# Patient Record
Sex: Male | Born: 1965 | Race: White | Hispanic: No | Marital: Married | Smoking: Never smoker
Health system: Southern US, Community
[De-identification: ages and names within clinical notes are randomized; demographics above are authoritative.]

## PROBLEM LIST (undated history)

## (undated) DIAGNOSIS — E119 Type 2 diabetes mellitus without complications: Secondary | ICD-10-CM

## (undated) DIAGNOSIS — Z87442 Personal history of urinary calculi: Secondary | ICD-10-CM

## (undated) DIAGNOSIS — M1990 Unspecified osteoarthritis, unspecified site: Secondary | ICD-10-CM

## (undated) DIAGNOSIS — Z9289 Personal history of other medical treatment: Secondary | ICD-10-CM

## (undated) DIAGNOSIS — I10 Essential (primary) hypertension: Secondary | ICD-10-CM

## (undated) DIAGNOSIS — S88919A Complete traumatic amputation of unspecified lower leg, level unspecified, initial encounter: Secondary | ICD-10-CM

## (undated) HISTORY — PX: LIPOMA EXCISION: SHX5283

## (undated) HISTORY — PX: BELOW KNEE LEG AMPUTATION: SUR23

---

## 2006-01-08 ENCOUNTER — Ambulatory Visit: Payer: Self-pay | Admitting: Specialist

## 2006-04-28 ENCOUNTER — Emergency Department: Payer: Self-pay | Admitting: Emergency Medicine

## 2006-05-13 ENCOUNTER — Ambulatory Visit: Payer: Self-pay | Admitting: Specialist

## 2007-01-08 ENCOUNTER — Ambulatory Visit: Payer: Self-pay | Admitting: Specialist

## 2008-01-26 ENCOUNTER — Ambulatory Visit: Payer: Self-pay | Admitting: Specialist

## 2008-03-22 ENCOUNTER — Ambulatory Visit: Payer: Self-pay | Admitting: Specialist

## 2008-10-18 ENCOUNTER — Ambulatory Visit: Payer: Self-pay | Admitting: Specialist

## 2012-05-19 ENCOUNTER — Ambulatory Visit: Payer: Self-pay | Admitting: General Practice

## 2012-07-01 ENCOUNTER — Ambulatory Visit: Payer: Self-pay | Admitting: Urology

## 2012-07-01 LAB — BASIC METABOLIC PANEL
Anion Gap: 8 (ref 7–16)
BUN: 15 mg/dL (ref 7–18)
Chloride: 106 mmol/L (ref 98–107)
Co2: 28 mmol/L (ref 21–32)
Creatinine: 1.07 mg/dL (ref 0.60–1.30)
EGFR (African American): >60
EGFR (Non-African Amer.): >60
Osmolality: 285 (ref 275–301)
Potassium: 3.9 mmol/L (ref 3.5–5.1)

## 2012-07-03 ENCOUNTER — Ambulatory Visit: Payer: Self-pay | Admitting: Urology

## 2015-01-17 ENCOUNTER — Emergency Department
Admit: 2015-01-17 | Disposition: A | Discharge status: Home / Self Care | Payer: Self-pay | Admitting: Emergency Medicine

## 2016-04-27 ENCOUNTER — Emergency Department
Admission: EM | Admit: 2016-04-27 | Discharge: 2016-04-27 | Disposition: A | Discharge status: Home / Self Care | Payer: Commercial Managed Care - HMO | Attending: Emergency Medicine | Admitting: Emergency Medicine

## 2016-04-27 ENCOUNTER — Emergency Department: Payer: Commercial Managed Care - HMO

## 2016-04-27 ENCOUNTER — Encounter: Payer: Self-pay | Admitting: Urgent Care

## 2016-04-27 DIAGNOSIS — I10 Essential (primary) hypertension: Secondary | ICD-10-CM | POA: Insufficient documentation

## 2016-04-27 DIAGNOSIS — Z87442 Personal history of urinary calculi: Secondary | ICD-10-CM | POA: Insufficient documentation

## 2016-04-27 DIAGNOSIS — N200 Calculus of kidney: Secondary | ICD-10-CM | POA: Diagnosis not present

## 2016-04-27 DIAGNOSIS — R109 Unspecified abdominal pain: Secondary | ICD-10-CM | POA: Diagnosis present

## 2016-04-27 HISTORY — DX: Essential (primary) hypertension: I10

## 2016-04-27 LAB — BASIC METABOLIC PANEL
ANION GAP: 7 (ref 5–15)
BUN: 29 mg/dL — ABNORMAL HIGH (ref 6–20)
CO2: 26 mmol/L (ref 22–32)
Calcium: 9 mg/dL (ref 8.9–10.3)
Chloride: 106 mmol/L (ref 101–111)
Creatinine, Ser: 1.07 mg/dL (ref 0.61–1.24)
GFR calc non Af Amer: >60 mL/min (ref >60)
Glucose, Bld: 143 mg/dL — ABNORMAL HIGH (ref 65–99)
POTASSIUM: 3.8 mmol/L (ref 3.5–5.1)
SODIUM: 139 mmol/L (ref 135–145)

## 2016-04-27 LAB — CBC WITH DIFFERENTIAL/PLATELET
BASOS ABS: 0.1 10*3/uL (ref 0–0.1)
Basophils Relative: 1 %
EOS ABS: 0.3 10*3/uL (ref 0–0.7)
HCT: 43 % (ref 40.0–52.0)
HEMOGLOBIN: 15.6 g/dL (ref 13.0–18.0)
Lymphocytes Relative: 31 %
Lymphs Abs: 2.9 10*3/uL (ref 1.0–3.6)
MCH: 31.5 pg (ref 26.0–34.0)
MCHC: 36.2 g/dL — ABNORMAL HIGH (ref 32.0–36.0)
MCV: 87.2 fL (ref 80.0–100.0)
Monocytes Absolute: 1.1 10*3/uL — ABNORMAL HIGH (ref 0.2–1.0)
Monocytes Relative: 12 %
Neutro Abs: 4.9 10*3/uL (ref 1.4–6.5)
PLATELETS: 207 10*3/uL (ref 150–440)
RBC: 4.93 MIL/uL (ref 4.40–5.90)
RDW: 13 % (ref 11.5–14.5)
WBC: 9.2 10*3/uL (ref 3.8–10.6)

## 2016-04-27 LAB — URINALYSIS COMPLETE WITH MICROSCOPIC (ARMC ONLY)
BACTERIA UA: NONE SEEN
Bilirubin Urine: NEGATIVE
Glucose, UA: 50 mg/dL — AB
KETONES UR: NEGATIVE mg/dL
LEUKOCYTES UA: NEGATIVE
Nitrite: NEGATIVE
PH: 5 (ref 5.0–8.0)
PROTEIN: NEGATIVE mg/dL
SPECIFIC GRAVITY, URINE: 1.019 (ref 1.005–1.030)
SQUAMOUS EPITHELIAL / LPF: NONE SEEN

## 2016-04-27 MED ORDER — ONDANSETRON HCL 4 MG/2ML IJ SOLN
INTRAMUSCULAR | Status: AC
  Administered 2016-04-27: 4 mg via INTRAVENOUS
  Filled 2016-04-27: qty 2

## 2016-04-27 MED ORDER — OXYCODONE-ACETAMINOPHEN 5-325 MG PO TABS
1.0000 | ORAL_TABLET | Freq: Once | ORAL | Status: AC
  Administered 2016-04-27: 1 via ORAL
  Filled 2016-04-27: qty 1

## 2016-04-27 MED ORDER — KETOROLAC TROMETHAMINE 30 MG/ML IJ SOLN
10.0000 mg | Freq: Once | INTRAMUSCULAR | Status: AC
  Administered 2016-04-27: 9.9 mg via INTRAVENOUS
  Filled 2016-04-27: qty 1

## 2016-04-27 MED ORDER — IBUPROFEN 800 MG PO TABS
800.0000 mg | ORAL_TABLET | Freq: Three times a day (TID) | ORAL | 0 refills | Status: DC | PRN
Start: 2016-04-27 — End: 2018-03-13

## 2016-04-27 MED ORDER — OXYCODONE-ACETAMINOPHEN 10-325 MG PO TABS: 1.0000 | ORAL_TABLET | Freq: Four times a day (QID) | ORAL | 0 refills | Status: DC | PRN

## 2016-04-27 MED ORDER — ONDANSETRON HCL 4 MG/2ML IJ SOLN
4.0000 mg | Freq: Once | INTRAMUSCULAR | Status: AC
  Administered 2016-04-27: 4 mg via INTRAVENOUS
  Filled 2016-04-27: qty 2

## 2016-04-27 MED ORDER — ONDANSETRON HCL 4 MG/2ML IJ SOLN
4.0000 mg | Freq: Once | INTRAMUSCULAR | Status: AC
  Administered 2016-04-27: 4 mg via INTRAVENOUS

## 2016-04-27 MED ORDER — HYDROMORPHONE HCL 1 MG/ML IJ SOLN
1.0000 mg | Freq: Once | INTRAMUSCULAR | Status: AC
  Administered 2016-04-27: 1 mg via INTRAVENOUS
  Filled 2016-04-27: qty 1

## 2016-04-27 MED ORDER — ONDANSETRON 4 MG PO TBDP: 4.0000 mg | ORAL_TABLET | Freq: Three times a day (TID) | ORAL | 0 refills | Status: DC | PRN

## 2016-04-27 MED ORDER — HYDROMORPHONE HCL 1 MG/ML IJ SOLN
1.0000 mg | Freq: Once | INTRAMUSCULAR | Status: AC
  Administered 2016-04-27: 1 mg via INTRAVENOUS

## 2016-04-27 MED ORDER — SODIUM CHLORIDE 0.9 % IV BOLUS (SEPSIS)
1000.0000 mL | Freq: Once | INTRAVENOUS | Status: AC
  Administered 2016-04-27: 1000 mL via INTRAVENOUS

## 2016-04-27 MED ORDER — TAMSULOSIN HCL 0.4 MG PO CAPS
0.4000 mg | ORAL_CAPSULE | Freq: Once | ORAL | Status: AC
  Administered 2016-04-27: 0.4 mg via ORAL
  Filled 2016-04-27: qty 1

## 2016-04-27 MED ORDER — TAMSULOSIN HCL 0.4 MG PO CAPS: 0.4000 mg | ORAL_CAPSULE | Freq: Every day | ORAL | 0 refills | Status: DC

## 2016-04-27 MED ORDER — HYDROMORPHONE HCL 1 MG/ML IJ SOLN
INTRAMUSCULAR | Status: AC
  Administered 2016-04-27: 1 mg via INTRAVENOUS
  Filled 2016-04-27: qty 1

## 2016-04-27 NOTE — ED Triage Notes (Signed)
Patient presents with c/o LEFT flank pain that start at 2230. (+) nausea. PMH significant for urolithiasis; last 4-5 years ago.

## 2016-04-27 NOTE — Discharge Instructions (Signed)
1. Take pain & nausea medicines as needed (Percocet/Zofran #30). Make sure to take a stool softener while taking narcotic pain medicines. 2. Take Flomax 0.4mg daily x 14 days. 3. Drink plenty of bottled or filtered water daily. 4. Return to the ER for worsening symptoms, persistent vomiting, fever, difficulty breathing or other concerns.  

## 2016-04-27 NOTE — ED Notes (Signed)
Patient transported to CT 

## 2016-04-27 NOTE — ED Provider Notes (Signed)
[facility] Emergency Department Provider Note   ____________________________________________   First MD Initiated Contact with Patient 04/27/16 0421     (approximate)  I have reviewed the triage vital signs and the nursing notes.   HISTORY  Chief Complaint Nephrolithiasis    HPI Ryan Allen is a 50 y.o. male who presents to the ED from home with a chief complaint of left flank pain. Patient has a history of nephrolithiasis status post lithotripsy, last stone approximately 4 years ago. Onset of left flank pain approximately 10:30 PM. Symptoms associated with nausea only. Patient denies associated fever, chills, chest pain, shortness of breath, abdominal pain, vomiting, dysuria, hematuria, testicular pain or swelling. Denies recent travel or trauma. Nothing makes his symptoms better or worse.   Past Medical History:  Diagnosis Date  . Hypertension   . Kidney stones     There are no active problems to display for this patient.   Past Surgical History:  Procedure Laterality Date  . BELOW KNEE LEG AMPUTATION Left     Prior to Admission medications   Medication Sig Start Date End Date Taking? Authorizing Provider  ibuprofen (ADVIL,MOTRIN) 800 MG tablet Take 1 tablet (800 mg total) by mouth every 8 (eight) hours as needed for moderate pain. 04/27/16   Irean Hong, MD  ondansetron (ZOFRAN ODT) 4 MG disintegrating tablet Take 1 tablet (4 mg total) by mouth every 8 (eight) hours as needed for nausea or vomiting. 04/27/16   Irean Hong, MD  oxyCODONE-acetaminophen (PERCOCET) 10-325 MG tablet Take 1 tablet by mouth every 6 (six) hours as needed for pain. 04/27/16   Irean Hong, MD  tamsulosin (FLOMAX) 0.4 MG CAPS capsule Take 1 capsule (0.4 mg total) by mouth daily. 04/27/16   Irean Hong, MD    Allergies Review of patient's allergies indicates no known allergies.  No family history on file.  Social History Social History  Substance Use Topics  . Smoking  status: Never Smoker  . Smokeless tobacco: Never Used  . Alcohol use Yes    Review of Systems  Constitutional: No fever/chills. Eyes: No visual changes. ENT: No sore throat. Cardiovascular: Denies chest pain. Respiratory: Denies shortness of breath. Gastrointestinal: Positive for left flank pain. No abdominal pain.  Positive for nausea, no vomiting.  No diarrhea.  No constipation. Genitourinary: Negative for dysuria. Musculoskeletal: Negative for back pain. Skin: Negative for rash. Neurological: Negative for headaches, focal weakness or numbness.  10-point ROS otherwise negative.  ____________________________________________   PHYSICAL EXAM:  VITAL SIGNS: ED Triage Vitals  Enc Vitals Group     BP 04/27/16 0358 (!) 159/112     Pulse Rate 04/27/16 0358 74     Resp 04/27/16 0358 20     Temp 04/27/16 0358 97.6 F (36.4 C)     Temp Source 04/27/16 0358 Oral     SpO2 04/27/16 0358 97 %     Weight 04/27/16 0358 275 lb (124.7 kg)     Height 04/27/16 0358  (1.803 m)     Head Circumference --      Peak Flow --      Pain Score 04/27/16 0359 10     Pain Loc --      Pain Edu? --      Excl. in GC? --     Constitutional: Alert and oriented. Well appearing and in moderate acute distress. Eyes: Conjunctivae are normal. PERRL. EOMI. Head: Atraumatic. Nose: No congestion/rhinnorhea. Mouth/Throat: Mucous membranes are  moist.  Oropharynx non-erythematous. Neck: No stridor.   Cardiovascular: Normal rate, regular rhythm. Grossly normal heart sounds.  Good peripheral circulation. Respiratory: Normal respiratory effort.  No retractions. Lungs CTAB. Gastrointestinal: Soft and nontender. No distention. No abdominal bruits. Mild left CVA tenderness. Musculoskeletal: No lower extremity tenderness nor edema.  No joint effusions. Neurologic:  Normal speech and language. No gross focal neurologic deficits are appreciated. No gait instability. Skin:  Skin is warm, dry and intact. No rash  noted. No vesicles. Psychiatric: Mood and affect are normal. Speech and behavior are normal.  ____________________________________________   LABS (all labs ordered are listed, but only abnormal results are displayed)  Labs Reviewed  CBC WITH DIFFERENTIAL/PLATELET - Abnormal; Notable for the following:       Result Value   MCHC 36.2 (*)    Monocytes Absolute 1.1 (*)    All other components within normal limits  BASIC METABOLIC PANEL - Abnormal; Notable for the following:    Glucose, Bld 143 (*)    BUN 29 (*)    All other components within normal limits  URINALYSIS COMPLETEWITH MICROSCOPIC ([facility] ONLY) - Abnormal; Notable for the following:    Color, Urine YELLOW (*)    APPearance HAZY (*)    Glucose, UA 50 (*)    Hgb urine dipstick 2+ (*)    All other components within normal limits   ____________________________________________  EKG  None ____________________________________________  RADIOLOGY  CT renal colic study interpreted per Dr. Karie Kirks: Mild LEFT hydroureteronephrosis with 7 mm LEFT bladder calculus, likely recently passed. Residual 7 mm LEFT lower pole nephrolithiasis. ____________________________________________   PROCEDURES  Procedure(s) performed: None  Procedures  Critical Care performed: No  ____________________________________________   INITIAL IMPRESSION / ASSESSMENT AND PLAN / ED COURSE  Pertinent labs & imaging results that were available during my care of the patient were reviewed by me and considered in my medical decision making (see chart for details).  50 year old male with a history of nephrolithiasis who presents with left flank pain. After 1 round of IV Dilaudid, pain has decreased from 8/10-6/10. Will re-dose with Dilaudid and obtain CT renal colic study.  Clinical Course  Comment By Time  Pain much improved, down to 3/10. Updated patient and spouse on laboratory, urinalysis and imaging results. Plan for analgesia, Flomax and  urology follow-up early next week. Strict return precautions given. Both verbalize understanding and agree with plan of care. Irean Hong, MD 08/04 [phone]     ____________________________________________   FINAL CLINICAL IMPRESSION(S) / ED DIAGNOSES  Final diagnoses:  Kidney stone      NEW MEDICATIONS STARTED DURING THIS VISIT:  New Prescriptions   IBUPROFEN (ADVIL,MOTRIN) 800 MG TABLET    Take 1 tablet (800 mg total) by mouth every 8 (eight) hours as needed for moderate pain.   ONDANSETRON (ZOFRAN ODT) 4 MG DISINTEGRATING TABLET    Take 1 tablet (4 mg total) by mouth every 8 (eight) hours as needed for nausea or vomiting.   OXYCODONE-ACETAMINOPHEN (PERCOCET) 10-325 MG TABLET    Take 1 tablet by mouth every 6 (six) hours as needed for pain.   TAMSULOSIN (FLOMAX) 0.4 MG CAPS CAPSULE    Take 1 capsule (0.4 mg total) by mouth daily.     Note:  This document was prepared using Dragon voice recognition software and may include unintentional dictation errors.    Irean Hong, MD 04/27/16 [phone]

## 2016-05-03 ENCOUNTER — Encounter: Admission: RE | Payer: Self-pay | Source: Ambulatory Visit

## 2016-05-03 ENCOUNTER — Ambulatory Visit: Admission: RE | Admit: 2016-05-03 | Payer: 59 | Source: Ambulatory Visit | Admitting: Urology

## 2016-05-03 SURGERY — LITHOTRIPSY, ESWL
Anesthesia: Moderate Sedation | Laterality: Right

## 2017-01-18 DIAGNOSIS — Z79899 Other long term (current) drug therapy: Secondary | ICD-10-CM | POA: Insufficient documentation

## 2017-01-18 DIAGNOSIS — Z791 Long term (current) use of non-steroidal anti-inflammatories (NSAID): Secondary | ICD-10-CM | POA: Insufficient documentation

## 2017-01-18 DIAGNOSIS — Y9389 Activity, other specified: Secondary | ICD-10-CM | POA: Insufficient documentation

## 2017-01-18 DIAGNOSIS — Y929 Unspecified place or not applicable: Secondary | ICD-10-CM | POA: Diagnosis not present

## 2017-01-18 DIAGNOSIS — X501XXA Overexertion from prolonged static or awkward postures, initial encounter: Secondary | ICD-10-CM | POA: Diagnosis not present

## 2017-01-18 DIAGNOSIS — I10 Essential (primary) hypertension: Secondary | ICD-10-CM | POA: Insufficient documentation

## 2017-01-18 DIAGNOSIS — S8991XA Unspecified injury of right lower leg, initial encounter: Secondary | ICD-10-CM | POA: Diagnosis present

## 2017-01-18 DIAGNOSIS — M79661 Pain in right lower leg: Secondary | ICD-10-CM | POA: Diagnosis not present

## 2017-01-18 DIAGNOSIS — M79604 Pain in right leg: Secondary | ICD-10-CM | POA: Diagnosis not present

## 2017-01-18 DIAGNOSIS — Y999 Unspecified external cause status: Secondary | ICD-10-CM | POA: Insufficient documentation

## 2017-01-18 NOTE — ED Triage Notes (Signed)
Pt presents to ED with c/o RIGHT leg pain x1 week with increasing intensity today. Pt reports pain has been intermittent until tonight when he felt "something pop" with increasing amounts of pain. Pt reports it "aches" at rest and pain increases significantly during movement and ambulation. Pt denies recent injury or trauma. 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

## 2017-01-19 ENCOUNTER — Emergency Department: Payer: 59

## 2017-01-19 ENCOUNTER — Emergency Department
Admission: EM | Admit: 2017-01-19 | Discharge: 2017-01-19 | Disposition: A | Discharge status: Home / Self Care | Payer: 59 | Attending: Emergency Medicine | Admitting: Emergency Medicine

## 2017-01-19 DIAGNOSIS — M79604 Pain in right leg: Secondary | ICD-10-CM

## 2017-01-19 NOTE — ED Notes (Signed)

## 2017-01-19 NOTE — ED Notes (Signed)
Pt in ultrasound at this time. Family in room denies needs.

## 2017-01-19 NOTE — ED Provider Notes (Signed)
[facility] Emergency Department Provider Note   ____________________________________________   First MD Initiated Contact with Patient 01/19/17 0025     (approximate)  I have reviewed the triage vital signs and the nursing notes.   HISTORY  Chief Complaint Leg Pain    HPI Ryan Allen is a 51 y.o. male patient reports he had some pain behind the right leg for about a week. He was getting off his 4 wheeler to go [place] hunting and felt a pop. The leg began to hurt a lot in the lateral part of the knee posteriorly. In other words to the lateral side of the area behind the knee. Patient reports pain trying to extend the leg. He can walk on it. He reports he lost his other leg in a work-related accident 20 years ago when a wall fell on him. He has no other complaints.   Past Medical History:  Diagnosis Date  . Hypertension   . Kidney stones     There are no active problems to display for this patient.   Past Surgical History:  Procedure Laterality Date  . BELOW KNEE LEG AMPUTATION Left     Prior to Admission medications   Medication Sig Start Date End Date Taking? Authorizing Provider  ibuprofen (ADVIL,MOTRIN) 800 MG tablet Take 1 tablet (800 mg total) by mouth every 8 (eight) hours as needed for moderate pain. 04/27/16   Irean Hong, MD  ondansetron (ZOFRAN ODT) 4 MG disintegrating tablet Take 1 tablet (4 mg total) by mouth every 8 (eight) hours as needed for nausea or vomiting. 04/27/16   Irean Hong, MD  oxyCODONE-acetaminophen (PERCOCET) 10-325 MG tablet Take 1 tablet by mouth every 6 (six) hours as needed for pain. 04/27/16   Irean Hong, MD  tamsulosin (FLOMAX) 0.4 MG CAPS capsule Take 1 capsule (0.4 mg total) by mouth daily. 04/27/16   Irean Hong, MD    Allergies Patient has no known allergies.  No family history on file.  Social History Social History  Substance Use Topics  . Smoking status: Never Smoker  . Smokeless tobacco: Never Used  .  Alcohol use Yes    Review of Systems  Constitutional: No fever/chills Eyes: No visual changes. ENT: No sore throat. Cardiovascular: Denies chest pain. Respiratory: Denies shortness of breath. Gastrointestinal: No abdominal pain.  No nausea, no vomiting.  No diarrhea.  No constipation. Genitourinary: Negative for dysuria. Musculoskeletal: Negative for back pain. Skin: Negative for rash. Neurological: Negative for headaches, focal weakness or numbness.  ____________________________________________   PHYSICAL EXAM:  VITAL SIGNS: ED Triage Vitals  Enc Vitals Group     BP 01/18/17 2204 (!) 139/93     Pulse Rate 01/18/17 2204 68     Resp 01/18/17 2204 18     Temp 01/18/17 2204 98.9 F (37.2 C)     Temp Source 01/18/17 2204 Oral     SpO2 01/18/17 2204 95 %     Weight 01/18/17 2159 285 lb (129.3 kg)     Height 01/18/17 2159  (1.803 m)     Head Circumference --      Peak Flow --      Pain Score --      Pain Loc --      Pain Edu? --      Excl. in GC? --     Constitutional: Alert and oriented. Well appearing and in no acute distress. Eyes: Conjunctivae are normal. PERRL. EOMI. Head: Atraumatic.  Nose: No congestion/rhinnorhea. Mouth/Throat: Mucous membranes are moist.  Oropharynx non-erythematous. Neck: No stridor. Cardiovascular: Normal rate, regular rhythm. Grossly normal heart sounds.  Good peripheral circulation. Musculoskeletal: Patient has a left BKA. His right leg has trace edema there is no knee effusion there is some tenderness in the area described above behind the knee. There is no tenderness in the calf. Patient has full range of motion of the ankle patient has some pain on movement of the knee especially if he tries to pick the lower leg up with the knee bent Neurologic:  Normal speech and language. No gross focal neurologic deficits are appreciated. No gait instability. Skin:  Skin is warm, dry and intact. No rash noted. Psychiatric: Mood and affect are  normal. Speech and behavior are normal.  ____________________________________________   LABS (all labs ordered are listed, but only abnormal results are displayed)  Labs Reviewed - No data to display ____________________________________________  EKG   ____________________________________________  RADIOLOGY  dy Result   CLINICAL DATA:  Right leg pain x1 week  EXAM: RIGHT KNEE - COMPLETE 4+ VIEW  COMPARISON:  None.  FINDINGS: No evidence of acute fracture, dislocation, or joint effusion. There appears be old fracture deformity of the proximal fibula. No evidence of arthropathy or other focal bone abnormality. Soft tissues are unremarkable.  IMPRESSION: No acute osseous abnormality. Suggestion old posttraumatic deformity of the proximal fibula.   Electronically Signed   By: Tollie Eth M.D.   On: 01/19/2017 01:27   Study Result   CLINICAL DATA:  RIGHT leg pain for 1 week.  EXAM: RIGHT LOWER EXTREMITY VENOUS DOPPLER ULTRASOUND  TECHNIQUE: Gray-scale sonography with graded compression, as well as color Doppler and duplex ultrasound were performed to evaluate the lower extremity deep venous systems from the level of the common femoral vein and including the common femoral, femoral, profunda femoral, popliteal and calf veins including the posterior tibial, peroneal and gastrocnemius veins when visible. The superficial great saphenous vein was also interrogated. Spectral Doppler was utilized to evaluate flow at rest and with distal augmentation maneuvers in the common femoral, femoral and popliteal veins.  COMPARISON:  None.  FINDINGS: Contralateral Common Femoral Vein: Respiratory phasicity is normal and symmetric with the symptomatic side. No evidence of thrombus. Normal compressibility.  Common Femoral Vein: No evidence of thrombus. Normal compressibility, respiratory phasicity and response to augmentation.  Profunda Femoral Vein: No  evidence of thrombus. Normal compressibility and flow on color Doppler imaging.  Femoral Vein: No evidence of thrombus. Normal compressibility, respiratory phasicity and response to augmentation.  Popliteal Vein: No evidence of thrombus. Normal compressibility, respiratory phasicity and response to augmentation.  Calf Veins: No evidence of thrombus. Normal compressibility and flow on color Doppler imaging.  Other Findings:  None.  IMPRESSION: No RIGHT lower extremity deep vein thrombosis.   Electronically Signed   By: Awilda Metro M.D.    ____________________________________________   PROCEDURES  Procedure(s) performed:   Procedures  Critical Care performed:   ____________________________________________   INITIAL IMPRESSION / ASSESSMENT AND PLAN / ED COURSE  Pertinent labs & imaging results that were available during my care of the patient were reviewed by me and considered in my medical decision making (see chart for details).        ____________________________________________   FINAL CLINICAL IMPRESSION(S) / ED DIAGNOSES  Final diagnoses:  Right leg pain      NEW MEDICATIONS STARTED DURING THIS VISIT:  New Prescriptions   No medications on file  Note:  This document was prepared using Dragon voice recognition software and may include unintentional dictation errors.    Arnaldo Natal, MD 01/19/17 [phone]

## 2017-01-19 NOTE — Discharge Instructions (Signed)
Use walker or crutches to help with the pain in your knee. Please follow-up with Dr. Martha Clan, orthopedics.  Call his office Monday morning. Tell them you were in the emergency room. They should be on CDU this coming week. Use Motrin 800 mg 3 times a day for the pain. Take it with food. Return for worse pain fever or swelling or redness.

## 2017-01-19 NOTE — ED Notes (Signed)
XR at bedside

## 2017-01-19 NOTE — ED Notes (Signed)
Pt. Returned to tx. room in stable condition with no acute changes since departure from unit for scans.   

## 2017-07-11 DIAGNOSIS — I10 Essential (primary) hypertension: Secondary | ICD-10-CM | POA: Diagnosis not present

## 2017-08-07 DIAGNOSIS — I10 Essential (primary) hypertension: Secondary | ICD-10-CM | POA: Diagnosis not present

## 2017-08-07 DIAGNOSIS — E781 Pure hyperglyceridemia: Secondary | ICD-10-CM | POA: Diagnosis not present

## 2017-08-07 DIAGNOSIS — Z89512 Acquired absence of left leg below knee: Secondary | ICD-10-CM | POA: Diagnosis not present

## 2017-08-08 DIAGNOSIS — E781 Pure hyperglyceridemia: Secondary | ICD-10-CM | POA: Diagnosis not present

## 2017-08-08 DIAGNOSIS — R5383 Other fatigue: Secondary | ICD-10-CM | POA: Diagnosis not present

## 2018-03-10 DIAGNOSIS — E781 Pure hyperglyceridemia: Secondary | ICD-10-CM | POA: Diagnosis not present

## 2018-03-10 DIAGNOSIS — R739 Hyperglycemia, unspecified: Secondary | ICD-10-CM | POA: Diagnosis not present

## 2018-03-10 DIAGNOSIS — I10 Essential (primary) hypertension: Secondary | ICD-10-CM | POA: Diagnosis not present

## 2018-03-12 DIAGNOSIS — R110 Nausea: Secondary | ICD-10-CM | POA: Diagnosis not present

## 2018-03-12 DIAGNOSIS — N201 Calculus of ureter: Secondary | ICD-10-CM | POA: Diagnosis not present

## 2018-03-12 DIAGNOSIS — N23 Unspecified renal colic: Secondary | ICD-10-CM | POA: Diagnosis not present

## 2018-03-17 ENCOUNTER — Encounter
Admission: RE | Admit: 2018-03-17 | Discharge: 2018-03-17 | Disposition: A | Discharge status: Home / Self Care | Payer: Commercial Managed Care - HMO | Source: Ambulatory Visit | Attending: Urology | Admitting: Urology

## 2018-03-17 ENCOUNTER — Other Ambulatory Visit: Payer: Self-pay

## 2018-03-17 HISTORY — DX: Complete traumatic amputation of unspecified lower leg, level unspecified, initial encounter: S88.919A

## 2018-03-17 HISTORY — DX: Personal history of other medical treatment: Z92.89

## 2018-03-17 HISTORY — DX: Personal history of urinary calculi: Z87.442

## 2018-03-17 HISTORY — DX: Unspecified osteoarthritis, unspecified site: M19.90

## 2018-03-17 HISTORY — DX: Type 2 diabetes mellitus without complications: E11.9

## 2018-03-17 NOTE — Pre-Procedure Instructions (Signed)
PT WAS A PREOP PHONE CALL TODAY,THE DAY BEFORE HIS SURGERY. PT IS NEEDING AN EKG DONE DUE TO HTN.  TRIED TO GET PT TO COME ON TO PAT TODAY FOR THIS AND HE IS WORKING OUT OF TOWN IN [place] AND CANNOT COME.  EKG TO BE DONE AM OF SURGERY

## 2018-03-17 NOTE — H&P (Signed)
NAME: Sherlene ShamsSPOON, Aristide C. MEDICAL RECORD [id number] ACCOUNT [id number] DATE OF BIRTH:Apr 06, 1966 FACILITY: [facility] LOCATION: [facility] PHYSICIAN:MICHAEL Gilles Chiquito. WOLFF, MD  HISTORY AND PHYSICAL  DATE OF ADMISSION:  03/18/2018  CHIEF COMPLAINT:  Left flank pain.    HISTORY OF PRESENT ILLNESS:  The patient is a 52 year old white male who presented to the office with a 3-4 week history of left flank pain.  He was found to have an 8 x 8 kidney stone at in the left UPJ.  He comes in now for cystoscopy with stent  placement.  The patient is on chronic aspirin and lithotripsy must be delayed at least one week because of this.  ALLERGIES:  No drug allergies.  MEDICATIONS:  Lisinopril and aspirin.  PAST SURGICAL HISTORY: 1.  Left knee amputation due to an injury in 1993. 2.  Lithotripsy 2013.  PAST AND CURRENT MEDICAL CONDITIONS:  Hypertension.  REVIEW OF SYSTEMS:  The patient denies chest pain, shortness of breath, diabetes, stroke or heart disease.  SOCIAL HISTORY:  The patient denied tobacco use.  He consumes five to seven alcoholic beverages per week.  FAMILY HISTORY:  Parents have heart disease, hypertension, hypercholesterolemia and diabetes.  PHYSICAL EXAMINATION: GENERAL:  Well-nourished white male in no acute distress. HEENT:  Sclerae were clear. NECK:  No audible carotid bruits. PULMONARY:  Lungs clear to auscultation. CARDIOVASCULAR:  Regular rhythm and rate. ABDOMEN:  Soft, nontender abdomen. GENITOURINARY:  Circumcised  with atrophic testes.  He had a 2 cm left spermatocele.   RECTAL EXAM:  Deferred. NEUROMUSCULAR:  Alert and oriented x3.  ASSESSMENT: 1.  Left ureteropelvic junction calculus with renal colic. 2.  Exposure to ASPIRIN.  PLAN:  Cystoscopy with left stent placement.  AN/NUANCE  D:03/17/2018 T:03/17/2018 JOB:[id number]

## 2018-03-17 NOTE — Patient Instructions (Signed)
Your procedure is scheduled on: 03-18-18  Report to Same Day Surgery 2nd floor medical mall [facility](Medical Mall Entrance-take elevator on left to 2nd floor.  Check in with surgery information desk.) To find out your arrival time please call [phone] between 1PM - 3PM on 03-17-18   Remember: Instructions that are not followed completely may result in serious medical risk, up to and including death, or upon the discretion of your surgeon and anesthesiologist your surgery may need to be rescheduled.    _x___ 1. Do not eat food after midnight the night before your procedure. NO GUM OR CANDY AFTER MIDNIGHT.  You may drink clear liquids up to 2 hours before you are scheduled to arrive at the hospital for your procedure.  Do not drink clear liquids within 2 hours of your scheduled arrival to the hospital.  Clear liquids include  --Water or Apple juice without pulp  --Clear carbohydrate beverage such as ClearFast or Gatorade  --Black Coffee or Clear Tea (No milk, no creamers, do not add anything to the coffee or Tea      __x__ 2. No Alcohol for 24 hours before or after surgery.   __x__3. No Smoking or e-cigarettes for 24 prior to surgery.  Do not use any chewable tobacco products for at least 6 hour prior to surgery   ____  4. Bring all medications with you on the day of surgery if instructed.    __x__ 5. Notify your doctor if there is any change in your medical condition     (cold, fever, infections).    x___6. On the morning of surgery brush your teeth with toothpaste and water.  You may rinse your mouth with mouth wash if you wish.  Do not swallow any toothpaste or mouthwash.   Do not wear jewelry, make-up, hairpins, clips or nail polish.  Do not wear lotions, powders, or perfumes. You may wear deodorant.  Do not shave 48 hours prior to surgery. Men may shave face and neck.  Do not bring valuables to the hospital.    [facility] is not responsible for any belongings or valuables.        Contacts, dentures or bridgework may not be worn into surgery.  Leave your suitcase in the car. After surgery it may be brought to your room.  For patients admitted to the hospital, discharge time is determined by your treatment team.  _  Patients discharged the day of surgery will not be allowed to drive home.  You will need someone to drive you home and stay with you the night of your procedure.    Please read over the following fact sheets that you were given:   [facility] Preparing for [facility]   _x___ Take anti-hypertensive listed below, cardiac, seizure, asthma, anti-reflux and psychiatric medicines. These include:  1. YOU MAY TAKE NUCYNTA DAY OF SURGERY IF NEEDED WITH A SMALL SIP OF WATER  2.  3.  4.  5.  6.  ____Fleets enema or Magnesium Citrate as directed.   ____ Use CHG Soap or sage wipes as directed on instruction sheet   ____ Use inhalers on the day of surgery and bring to hospital day of surgery  ____ Stop Metformin and Janumet 2 days prior to surgery.    ____ Take 1/2 of usual insulin dose the night before surgery and none on the morning surgery.   ____ Follow recommendations from Cardiologist, Pulmonologist or PCP regarding  stopping Aspirin, Coumadin, Plavix ,Eliquis, Effient, or Pradaxa,  and Pletal.  X____Stop Anti-inflammatories such as Advil, Aleve, Ibuprofen, Motrin, Naproxen, Naprosyn, Goodies powders or aspirin products NOW- OK to take Tylenol OR NUCYNTA IF NEEDED   ____ Stop supplements until after surgery.    ____ Bring C-Pap to the hospital.

## 2018-03-18 ENCOUNTER — Ambulatory Visit
Admission: RE | Admit: 2018-03-18 | Discharge: 2018-03-18 | Disposition: A | Discharge status: Home / Self Care | Payer: Commercial Managed Care - HMO | Source: Ambulatory Visit | Attending: Urology | Admitting: Urology

## 2018-03-18 ENCOUNTER — Ambulatory Visit: Payer: Commercial Managed Care - HMO | Admitting: Certified Registered"

## 2018-03-18 ENCOUNTER — Encounter
Admission: RE | Disposition: A | Discharge status: Home / Self Care | Payer: Self-pay | Source: Ambulatory Visit | Attending: Urology

## 2018-03-18 DIAGNOSIS — N201 Calculus of ureter: Secondary | ICD-10-CM | POA: Diagnosis present

## 2018-03-18 DIAGNOSIS — N200 Calculus of kidney: Secondary | ICD-10-CM | POA: Diagnosis not present

## 2018-03-18 DIAGNOSIS — Z89512 Acquired absence of left leg below knee: Secondary | ICD-10-CM | POA: Insufficient documentation

## 2018-03-18 DIAGNOSIS — Z8249 Family history of ischemic heart disease and other diseases of the circulatory system: Secondary | ICD-10-CM | POA: Insufficient documentation

## 2018-03-18 DIAGNOSIS — Z833 Family history of diabetes mellitus: Secondary | ICD-10-CM | POA: Diagnosis not present

## 2018-03-18 DIAGNOSIS — Z7982 Long term (current) use of aspirin: Secondary | ICD-10-CM | POA: Insufficient documentation

## 2018-03-18 DIAGNOSIS — Z79899 Other long term (current) drug therapy: Secondary | ICD-10-CM | POA: Insufficient documentation

## 2018-03-18 DIAGNOSIS — M1990 Unspecified osteoarthritis, unspecified site: Secondary | ICD-10-CM | POA: Diagnosis not present

## 2018-03-18 DIAGNOSIS — E119 Type 2 diabetes mellitus without complications: Secondary | ICD-10-CM | POA: Diagnosis not present

## 2018-03-18 DIAGNOSIS — I10 Essential (primary) hypertension: Secondary | ICD-10-CM | POA: Diagnosis not present

## 2018-03-18 HISTORY — PX: CYSTOSCOPY W/ URETERAL STENT PLACEMENT: SHX1429

## 2018-03-18 HISTORY — PX: CYSTOSCOPY WITH STENT PLACEMENT: SHX5790

## 2018-03-18 LAB — GLUCOSE, CAPILLARY
GLUCOSE-CAPILLARY: 143 mg/dL — AB (ref 70–99)
Glucose-Capillary: 144 mg/dL — ABNORMAL HIGH (ref 70–99)

## 2018-03-18 SURGERY — CYSTOSCOPY, WITH STENT INSERTION
Anesthesia: General | Laterality: Left | Wound class: "Clean Contaminated "

## 2018-03-18 MED ORDER — LIDOCAINE HCL URETHRAL/MUCOSAL 2 % EX GEL
CUTANEOUS | Status: AC
Start: 2018-03-18 — End: ?
  Filled 2018-03-18: qty 10

## 2018-03-18 MED ORDER — PROPOFOL 10 MG/ML IV BOLUS
INTRAVENOUS | Status: DC | PRN
  Administered 2018-03-18: 200 mg via INTRAVENOUS

## 2018-03-18 MED ORDER — CIPROFLOXACIN HCL 500 MG PO TABS: 500.0000 mg | ORAL_TABLET | Freq: Once | ORAL | 0 refills | Status: AC

## 2018-03-18 MED ORDER — ONDANSETRON HCL 4 MG/2ML IJ SOLN
INTRAMUSCULAR | Status: DC | PRN
  Administered 2018-03-18: 4 mg via INTRAVENOUS

## 2018-03-18 MED ORDER — FENTANYL CITRATE (PF) 100 MCG/2ML IJ SOLN
INTRAMUSCULAR | Status: AC
  Filled 2018-03-18: qty 2

## 2018-03-18 MED ORDER — LIDOCAINE HCL URETHRAL/MUCOSAL 2 % EX GEL
CUTANEOUS | Status: DC | PRN
  Administered 2018-03-18: 1

## 2018-03-18 MED ORDER — PHENYLEPHRINE HCL 10 MG/ML IJ SOLN
INTRAMUSCULAR | Status: DC | PRN
  Administered 2018-03-18 (×2): 150 ug via INTRAVENOUS

## 2018-03-18 MED ORDER — DOCUSATE SODIUM 100 MG PO CAPS: 200.0000 mg | ORAL_CAPSULE | Freq: Two times a day (BID) | ORAL | 3 refills | Status: AC

## 2018-03-18 MED ORDER — BELLADONNA ALKALOIDS-OPIUM 16.2-60 MG RE SUPP
RECTAL | Status: DC | PRN
  Administered 2018-03-18: 1 via RECTAL

## 2018-03-18 MED ORDER — HYOSCYAMINE SULFATE SL 0.125 MG SL SUBL: 0.1250 mg | SUBLINGUAL_TABLET | SUBLINGUAL | 3 refills | Status: DC | PRN

## 2018-03-18 MED ORDER — ACETAMINOPHEN-CODEINE #3 300-30 MG PO TABS: 1.0000 | ORAL_TABLET | ORAL | 2 refills | Status: DC | PRN

## 2018-03-18 MED ORDER — FAMOTIDINE 20 MG PO TABS
ORAL_TABLET | ORAL | Status: AC
  Filled 2018-03-18: qty 1

## 2018-03-18 MED ORDER — IOTHALAMATE MEGLUMINE 43 % IV SOLN
INTRAVENOUS | Status: DC | PRN
  Administered 2018-03-18: 15 mL

## 2018-03-18 MED ORDER — CEFAZOLIN SODIUM-DEXTROSE 2-3 GM-%(50ML) IV SOLR
INTRAVENOUS | Status: DC | PRN
  Administered 2018-03-18: 2 g via INTRAVENOUS

## 2018-03-18 MED ORDER — CEFAZOLIN SODIUM-DEXTROSE 1-4 GM/50ML-% IV SOLN: 1.0000 g | Freq: Once | INTRAVENOUS | Status: DC

## 2018-03-18 MED ORDER — FAMOTIDINE 20 MG PO TABS
20.0000 mg | ORAL_TABLET | Freq: Once | ORAL | Status: AC
  Administered 2018-03-18: 20 mg via ORAL

## 2018-03-18 MED ORDER — FENTANYL CITRATE (PF) 100 MCG/2ML IJ SOLN
INTRAMUSCULAR | Status: DC | PRN
  Administered 2018-03-18 (×2): 50 ug via INTRAVENOUS

## 2018-03-18 MED ORDER — PROMETHAZINE HCL 25 MG/ML IJ SOLN: 6.2500 mg | INTRAMUSCULAR | Status: DC | PRN

## 2018-03-18 MED ORDER — CIPROFLOXACIN HCL 500 MG PO TABS: 500.0000 mg | ORAL_TABLET | Freq: Two times a day (BID) | ORAL | 0 refills | Status: DC

## 2018-03-18 MED ORDER — BELLADONNA ALKALOIDS-OPIUM 16.2-60 MG RE SUPP
RECTAL | Status: AC
  Filled 2018-03-18: qty 1

## 2018-03-18 MED ORDER — FENTANYL CITRATE (PF) 100 MCG/2ML IJ SOLN
25.0000 ug | INTRAMUSCULAR | Status: DC | PRN
  Administered 2018-03-18: 25 ug via INTRAVENOUS

## 2018-03-18 MED ORDER — DEXAMETHASONE SODIUM PHOSPHATE 10 MG/ML IJ SOLN: INTRAMUSCULAR | Status: DC | PRN

## 2018-03-18 MED ORDER — SODIUM CHLORIDE 0.9 % IV SOLN
INTRAVENOUS | Status: DC
  Administered 2018-03-18: 13:00:00 via INTRAVENOUS

## 2018-03-18 MED ORDER — KETOROLAC TROMETHAMINE 15 MG/ML IJ SOLN
INTRAMUSCULAR | Status: DC | PRN
  Administered 2018-03-18: 30 mg via INTRAVENOUS

## 2018-03-18 MED ORDER — LIDOCAINE HCL (CARDIAC) PF 100 MG/5ML IV SOSY
PREFILLED_SYRINGE | INTRAVENOUS | Status: DC | PRN
  Administered 2018-03-18: 100 mg via INTRAVENOUS

## 2018-03-18 MED ORDER — PROPOFOL 10 MG/ML IV BOLUS
INTRAVENOUS | Status: AC
  Filled 2018-03-18: qty 20

## 2018-03-18 MED ORDER — CEFAZOLIN SODIUM-DEXTROSE 2-4 GM/100ML-% IV SOLN
INTRAVENOUS | Status: AC
  Filled 2018-03-18: qty 100

## 2018-03-18 SURGICAL SUPPLY — 20 items
BAG DRAIN CYSTO-URO LG1000N (MISCELLANEOUS) ×3 IMPLANT
CATH URETL 5X70 OPEN END (CATHETERS) ×2 IMPLANT
CONRAY 43 FOR UROLOGY 50M (MISCELLANEOUS) ×3 IMPLANT
GLOVE BIO SURGEON STRL SZ7 (GLOVE) ×4 IMPLANT
GLOVE BIO SURGEON STRL SZ7.5 (GLOVE) ×3 IMPLANT
GOWN STRL REUS W/ TWL LRG LVL4 (GOWN DISPOSABLE) ×1 IMPLANT
GOWN STRL REUS W/ TWL XL LVL3 (GOWN DISPOSABLE) ×1 IMPLANT
GOWN STRL REUS W/TWL LRG LVL4 (GOWN DISPOSABLE) ×2
GOWN STRL REUS W/TWL XL LVL3 (GOWN DISPOSABLE) ×2
GUIDEWIRE STR ZIPWIRE 035X150 (MISCELLANEOUS) ×3 IMPLANT
KIT TURNOVER CYSTO (KITS) ×3 IMPLANT
PACK CYSTO AR (MISCELLANEOUS) ×3 IMPLANT
SET CYSTO W/LG BORE CLAMP LF (SET/KITS/TRAYS/PACK) ×3 IMPLANT
SOL .9 NS 3000ML IRR  AL (IV SOLUTION) ×2
SOL .9 NS 3000ML IRR UROMATIC (IV SOLUTION) ×1 IMPLANT
SOL PREP PVP 2OZ (MISCELLANEOUS)
SOLUTION PREP PVP 2OZ (MISCELLANEOUS) ×1 IMPLANT
STENT URET 6FRX24 CONTOUR (STENTS) ×1 IMPLANT
STENT URET 6FRX26 CONTOUR (STENTS) ×3 IMPLANT
WATER STERILE IRR 1000ML POUR (IV SOLUTION) ×3 IMPLANT

## 2018-03-18 NOTE — H&P (Signed)
Date of Initial H&P: 03/17/18  History reviewed, patient examined, no change in status, stable for surgery. 

## 2018-03-18 NOTE — Discharge Instructions (Signed)
Kidney Stones Kidney stones (urolithiasis) are rock-like masses that form inside of the kidneys. Kidneys are organs that make pee (urine). A kidney stone can cause very bad pain and can block the flow of pee. The stone usually leaves your body (passes) through your pee. You may need to have a doctor take out the stone. Follow these instructions at home: Eating and drinking  Drink enough fluid to keep your pee clear or pale yellow. This will help you pass the stone.  If told by your doctor, change the foods you eat (your diet). This may include: ? Limiting how much salt (sodium) you eat. ? Eating more fruits and vegetables. ? Limiting how much meat, poultry, fish, and eggs you eat.  Follow instructions from your doctor about eating or drinking restrictions. General instructions  Collect pee samples as told by your doctor. You may need to collect a pee sample: ? 24 hours after a stone comes out. ? 8-12 weeks after a stone comes out, and every 6-12 months after that.  Strain your pee every time you pee (urinate), for as long as told. Use the strainer that your doctor recommends.  Do not throw out the stone. Keep it so that it can be tested by your doctor.  Take over-the-counter and prescription medicines only as told by your doctor.  Keep all follow-up visits as told by your doctor. This is important. You may need follow-up tests. Preventing kidney stones To prevent another kidney stone:  Drink enough fluid to keep your pee clear or pale yellow. This is the best way to prevent kidney stones.  Eat healthy foods.  Avoid certain foods as told by your doctor. You may be told to eat less protein.  Stay at a healthy weight.  Contact a doctor if:  You have pain that gets worse or does not get better with medicine. Get help right away if:  You have a fever or chills.  You get very bad pain.  You get new pain in your belly (abdomen).  You pass out (faint).  You cannot pee. This  information is not intended to replace advice given to you by your health care provider. Make sure you discuss any questions you have with your health care provider. Document Released: 02/27/2008 Document Revised: 05/29/2016 Document Reviewed: 05/29/2016 Elsevier Interactive Patient Education  2017 [employer]. Ureteral Stent Implantation, Care After Refer to this sheet in the next few weeks. These instructions provide you with information about caring for yourself after your procedure. Your health care provider may also give you more specific instructions. Your treatment has been planned according to current medical practices, but problems sometimes occur. Call your health care provider if you have any problems or questions after your procedure. What can I expect after the procedure? After the procedure, it is common to have:  Nausea.  Mild pain when you urinate. You may feel this pain in your lower back or lower abdomen. Pain should stop within a few minutes after you urinate. This may last for up to 1 week.  A small amount of blood in your urine for several days.  Follow these instructions at home:  Medicines  Take over-the-counter and prescription medicines only as told by your health care provider.  If you were prescribed an antibiotic medicine, take it as told by your health care provider. Do not stop taking the antibiotic even if you start to feel better.  Do not drive for 24 hours if you received a sedative.  Do not drive or operate heavy machinery while taking prescription pain medicines. Activity  Return to your normal activities as told by your health care provider. Ask your health care provider what activities are safe for you.  Do not lift anything that is heavier than 10 lb (4.5 kg). Follow this limit for 1 week after your procedure, or for as long as told by your health care provider. General instructions  Watch for any blood in your urine. Call your health care  provider if the amount of blood in your urine increases.  If you have a catheter: ? Follow instructions from your health care provider about taking care of your catheter and collection bag. ? Do not take baths, swim, or use a hot tub until your health care provider approves.  Drink enough fluid to keep your urine clear or pale yellow.  Keep all follow-up visits as told by your health care provider. This is important. Contact a health care provider if:  You have pain that gets worse or does not get better with medicine, especially pain when you urinate.  You have difficulty urinating.  You feel nauseous or you vomit repeatedly during a period of more than 2 days after the procedure. Get help right away if:  Your urine is dark red or has blood clots in it.  You are leaking urine (have incontinence).  The end of the stent comes out of your urethra.  You cannot urinate.  You have sudden, sharp, or severe pain in your abdomen or lower back.  You have a fever. This information is not intended to replace advice given to you by your health care provider. Make sure you discuss any questions you have with your health care provider. Document Released: 05/13/2013 Document Revised: 02/16/2016 Document Reviewed: 03/25/2015 Elsevier Interactive Patient Education  [employer]2018 [employer] Inc[employer]

## 2018-03-18 NOTE — Anesthesia Preprocedure Evaluation (Signed)
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:   Pulmonary neg shortness of breath, neg sleep apnea, neg COPD, Recent URI , Resolved,           Cardiovascular Exercise Tolerance: Good hypertension, (-) angina(-) CAD, (-) Past MI, (-) Cardiac Stents and (-) CABG (-) dysrhythmias (-) Valvular Problems/Murmurs     Neuro/Psych negative neurological ROS  negative psych ROS   GI/Hepatic negative GI ROS, Neg liver ROS,   Endo/Other  negative endocrine ROSdiabetes, Type 2  Renal/GU Renal disease (kidney stones)  negative genitourinary   Musculoskeletal   Abdominal   Peds  Hematology negative hematology ROS (+)   Anesthesia Other Findings Past Medical History: No date: Amputation of leg (HCC) No date: Arthritis No date: Diabetes mellitus without complication (HCC)     Comment:  HGB A1C 8.0 ON 03-10-18-NO MEDS No date: History of blood transfusion No date: History of kidney stones No date: Hypertension   Reproductive/Obstetrics negative OB ROS                             Anesthesia Physical Anesthesia Plan  ASA: III  Anesthesia Plan: General   Post-op Pain Management:    Induction: Intravenous  PONV Risk Score and Plan: 2 and Ondansetron and Dexamethasone  Airway Management Planned: LMA and Oral ETT  Additional Equipment:   Intra-op Plan:   Post-operative Plan: Extubation in OR  Informed Consent: I have reviewed the patients History and Physical, chart,  labs and discussed the procedure including the risks, benefits and alternatives for the proposed anesthesia with the patient or authorized representative who has indicated his/her understanding and acceptance.   Dental Advisory Given  Plan Discussed with: Anesthesiologist, CRNA and Surgeon  Anesthesia Plan Comments:         Anesthesia Quick Evaluation

## 2018-03-18 NOTE — Anesthesia Post-op Follow-up Note (Signed)
Anesthesia QCDR form completed.        

## 2018-03-18 NOTE — Anesthesia Procedure Notes (Signed)
Procedure Name: LMA Insertion Date/Time: 03/18/2018 1:15 PM Performed by: Clyde Lundborgisser, Judeth Gilles L, CRNA Pre-anesthesia Checklist: Patient identified, Emergency Drugs available, Suction available and Patient being monitored Patient Re-evaluated:Patient Re-evaluated prior to induction Oxygen Delivery Method: Circle system utilized Preoxygenation: Pre-oxygenation with 100% oxygen Induction Type: IV induction Ventilation: Mask ventilation without difficulty LMA Size: 5.0 Number of attempts: 1 Placement Confirmation: positive ETCO2,  CO2 detector and breath sounds checked- equal and bilateral Tube secured with: Tape Dental Injury: Teeth and Oropharynx as per pre-operative assessment

## 2018-03-18 NOTE — Op Note (Signed)
Preoperative diagnosis: Left ureterolithiasis  Postoperative diagnosis: Same  Procedure: 1.  Cystoscopy with left double pigtail ureteral stent placement                      2.  Left retrograde pyelogram                      3.  Uroscopy  Surgeon: Suszanne ConnersMichael R. Evelene CroonWolff MD  Anesthesia: General  Indications:See the history and physical. After informed consent the above procedure(s) were requested     Technique and findings: After adequate general anesthesia been obtained the patient was placed into dorsal lithotomy position and the perineum was prepped and draped in the usual fashion.  We confirmed the presence of an 8 x 8 mm stone at the level of the left UPJ.  The 6721 French scope was coupled the camera and visually advanced to the bladder.  The bladder was thoroughly inspected.  No bladder mucosal lesions were identified.  Both ureteral orifices were identified and had clear efflux.  A 6 French ureteral catheter was used to cannulate the left orifice and retrograde pyelography was performed.  Stone was located at the UPJ and contrast could not be seen entering the collecting structures.  The open-ended ureteral stent was then used to push the stone back into the renal pelvis.  Contrast was then seen entering electing structures and no other abnormalities were identified.  A 0.035 Glidewire was advanced through the stent into the renal pelvis.  The open-ended ureteral catheter was removed taking care to leave the guidewire in position.  A 6 x 26 cm double-pigtail stent with retrieval suture attached was advanced over the guidewire and positioned in the ureter.  The guidewire was then removed taking care to leave the stent in position.  The bladder was drained and cystoscope was removed.  10 cc of viscous Xylocaine was instilled within the urethra and the bladder.  A B&O suppository was placed.  The procedure was then terminated and patient transferred to recovery room in stable condition.

## 2018-03-18 NOTE — OR Nursing (Signed)
Discharge instructions discussed with pt and wife. Both voice understanding. 

## 2018-03-18 NOTE — Transfer of Care (Signed)
Immediate Anesthesia Transfer of Care Note  Patient: Ryan ShamsJeff C Allen  Procedure(s) Performed: CYSTOSCOPY WITH STENT PLACEMENT (Left ) CYSTOSCOPY WITH RETROGRADE PYELOGRAM/URETERAL STENT PLACEMENT (Left )  Patient Location: PACU  Anesthesia Type:General  Level of Consciousness: awake, alert  and oriented  Airway & Oxygen Therapy: Patient Spontanous Breathing  Post-op Assessment: Report given to RN, Post -op Vital signs reviewed and stable and Patient moving all extremities  Post vital signs: Reviewed and stable  Last Vitals:  Vitals Value Taken Time  BP    Temp    Pulse 94 03/18/2018  1:58 PM  Resp    SpO2 94 % 03/18/2018  1:58 PM  Vitals shown include unvalidated device data.  Last Pain:  Vitals:   03/18/18 1238  TempSrc: Oral  PainSc: 0-No pain         Complications: No apparent anesthesia complications

## 2018-03-19 ENCOUNTER — Encounter: Payer: Self-pay | Admitting: Urology

## 2018-03-19 NOTE — Anesthesia Postprocedure Evaluation (Signed)
Anesthesia Post Note  Patient: Ryan ShamsJeff C Allen  Procedure(s) Performed: CYSTOSCOPY WITH STENT PLACEMENT (Left ) CYSTOSCOPY WITH RETROGRADE PYELOGRAM/URETERAL STENT PLACEMENT (Left )  Patient location during evaluation: PACU Anesthesia Type: General Level of consciousness: awake and alert Pain management: pain level controlled Vital Signs Assessment: post-procedure vital signs reviewed and stable Respiratory status: spontaneous breathing, nonlabored ventilation, respiratory function stable and patient connected to nasal cannula oxygen Cardiovascular status: blood pressure returned to baseline and stable Postop Assessment: no apparent nausea or vomiting Anesthetic complications: no     Last Vitals:  Vitals:   03/18/18 1438 03/18/18 1500  BP: 104/69 115/73  Pulse: 74 69  Resp: 14 16  Temp:  36.4 C  SpO2: 96% 96%    Last Pain:  Vitals:   03/19/18 0812  TempSrc:   PainSc: 0-No pain                 Lenard SimmerAndrew Silena Wyss

## 2018-03-31 DIAGNOSIS — N201 Calculus of ureter: Secondary | ICD-10-CM | POA: Diagnosis not present

## 2018-03-31 DIAGNOSIS — N200 Calculus of kidney: Secondary | ICD-10-CM | POA: Diagnosis not present

## 2018-03-31 DIAGNOSIS — R310 Gross hematuria: Secondary | ICD-10-CM | POA: Diagnosis not present

## 2018-04-10 ENCOUNTER — Ambulatory Visit
Admission: RE | Admit: 2018-04-10 | Discharge: 2018-04-10 | Disposition: A | Discharge status: Home / Self Care | Payer: 59 | Source: Ambulatory Visit | Attending: Urology | Admitting: Urology

## 2018-04-10 ENCOUNTER — Encounter
Admission: RE | Disposition: A | Discharge status: Home / Self Care | Payer: Self-pay | Source: Ambulatory Visit | Attending: Urology

## 2018-04-10 ENCOUNTER — Other Ambulatory Visit: Payer: Self-pay

## 2018-04-10 ENCOUNTER — Encounter: Payer: Self-pay | Admitting: Urology

## 2018-04-10 DIAGNOSIS — I10 Essential (primary) hypertension: Secondary | ICD-10-CM | POA: Diagnosis not present

## 2018-04-10 DIAGNOSIS — Z6834 Body mass index (BMI) 34.0-34.9, adult: Secondary | ICD-10-CM | POA: Insufficient documentation

## 2018-04-10 DIAGNOSIS — N200 Calculus of kidney: Secondary | ICD-10-CM | POA: Diagnosis not present

## 2018-04-10 DIAGNOSIS — E669 Obesity, unspecified: Secondary | ICD-10-CM | POA: Diagnosis not present

## 2018-04-10 HISTORY — PX: EXTRACORPOREAL SHOCK WAVE LITHOTRIPSY: SHX1557

## 2018-04-10 SURGERY — LITHOTRIPSY, ESWL
Anesthesia: Moderate Sedation | Laterality: Left

## 2018-04-10 MED ORDER — MIDAZOLAM HCL 2 MG/2ML IJ SOLN
1.0000 mg | Freq: Once | INTRAMUSCULAR | Status: AC
  Administered 2018-04-10: 1 mg via INTRAMUSCULAR

## 2018-04-10 MED ORDER — CIPROFLOXACIN HCL 500 MG PO TABS: 500.0000 mg | ORAL_TABLET | Freq: Two times a day (BID) | ORAL | 0 refills | Status: AC

## 2018-04-10 MED ORDER — FUROSEMIDE 10 MG/ML IJ SOLN
INTRAMUSCULAR | Status: AC
  Administered 2018-04-10: 10 mg
  Filled 2018-04-10: qty 2

## 2018-04-10 MED ORDER — FUROSEMIDE 10 MG/ML IJ SOLN
10.0000 mg | Freq: Once | INTRAMUSCULAR | Status: AC
  Administered 2018-04-10: 10 mg via INTRAVENOUS

## 2018-04-10 MED ORDER — FUROSEMIDE 10 MG/ML IJ SOLN
10.0000 mg | Freq: Once | INTRAMUSCULAR | Status: DC
  Administered 2018-04-10: 10 mg via INTRAVENOUS

## 2018-04-10 MED ORDER — ONDANSETRON 8 MG PO TBDP: 8.0000 mg | ORAL_TABLET | Freq: Four times a day (QID) | ORAL | 3 refills | Status: AC | PRN

## 2018-04-10 MED ORDER — DIPHENHYDRAMINE HCL 25 MG PO CAPS
25.0000 mg | ORAL_CAPSULE | ORAL | Status: AC
  Administered 2018-04-10: 25 mg via ORAL

## 2018-04-10 MED ORDER — DEXTROSE-NACL 5-0.45 % IV SOLN
INTRAVENOUS | Status: DC
  Administered 2018-04-10: 12:00:00 via INTRAVENOUS

## 2018-04-10 MED ORDER — NUCYNTA 50 MG PO TABS: 50.0000 mg | ORAL_TABLET | Freq: Four times a day (QID) | ORAL | 0 refills | Status: AC | PRN

## 2018-04-10 MED ORDER — TAMSULOSIN HCL 0.4 MG PO CAPS: 0.4000 mg | ORAL_CAPSULE | Freq: Every day | ORAL | 0 refills | Status: AC

## 2018-04-10 MED ORDER — PROMETHAZINE HCL 25 MG/ML IJ SOLN
25.0000 mg | Freq: Once | INTRAMUSCULAR | Status: AC
  Administered 2018-04-10: 25 mg via INTRAMUSCULAR

## 2018-04-10 MED ORDER — MORPHINE SULFATE (PF) 10 MG/ML IV SOLN
10.0000 mg | Freq: Once | INTRAVENOUS | Status: AC
  Administered 2018-04-10: 10 mg via INTRAMUSCULAR

## 2018-04-10 MED ORDER — LEVOFLOXACIN 500 MG PO TABS
500.0000 mg | ORAL_TABLET | Freq: Once | ORAL | Status: AC
  Administered 2018-04-10: 500 mg via ORAL

## 2018-04-10 NOTE — Discharge Instructions (Signed)
Kidney Stones °Kidney stones (urolithiasis) are solid, rock-like deposits that form inside of the organs that make urine (kidneys). A kidney stone may form in a kidney and move into the bladder, where it can cause intense pain and block the flow of urine. Kidney stones are created when high levels of certain minerals are found in the urine. They are usually passed through urination, but in some cases, medical treatment may be needed to remove them. °What are the causes? °Kidney stones may be caused by: °· A condition in which certain glands produce too much parathyroid hormone (primary hyperparathyroidism), which causes too much calcium buildup in the blood. °· Buildup of uric acid crystals in the bladder (hyperuricosuria). Uric acid is a chemical that the body produces when you eat certain foods. It usually exits the body in the urine. °· Narrowing (stricture) of one or both of the tubes that drain urine from the kidneys to the bladder (ureters). °· A kidney blockage that is present at birth (congenital obstruction). °· Past surgery on the kidney or the ureters, such as gastric bypass surgery. ° °What increases the risk? °The following factors make you more likely to develop kidney stones: °· Having had a kidney stone in the past. °· Having a family history of kidney stones. °· Not drinking enough water. °· Eating a diet that is high in protein, salt (sodium), or sugar. °· Being overweight or obese. ° °What are the signs or symptoms? °Symptoms of a kidney stone may include: °· Nausea. °· Vomiting. °· Blood in the urine (hematuria). °· Pain in the side of the abdomen, right below the ribs (flank pain). Pain usually spreads (radiates) to the groin. °· Needing to urinate frequently or urgently. ° °How is this diagnosed? °This condition may be diagnosed based on: °· Your medical history. °· A physical exam. °· Blood tests. °· Urine tests. °· CT scan. °· Abdominal X-ray. °· A procedure to examine the inside of the  bladder (cystoscopy). ° °How is this treated? °Treatment for kidney stones depends on the size, location, and makeup of the stones. Treatment may involve: °· Analyzing your urine before and after you pass the stone through urination. °· Being monitored at the hospital until you pass the stone through urination. °· Increasing your fluid intake and decreasing the amount of calcium and protein in your diet. °· A procedure to break up kidney stones in the bladder using: °? A focused beam of light (laser therapy). °? Shock waves (extracorporeal shock wave lithotripsy). °· Surgery to remove kidney stones. This may be needed if you have severe pain or have stones that block your urinary tract. ° °Follow these instructions at home: °Eating and drinking ° °· Drink enough fluid to keep your urine clear or pale yellow. This will help you to pass the kidney stone. °· If directed, change your diet. This may include: °? Limiting how much sodium you eat. °? Eating more fruits and vegetables. °? Limiting how much meat, poultry, fish, and eggs you eat. °· Follow instructions from your health care provider about eating or drinking restrictions. °General instructions °· Collect urine samples as told by your health care provider. You may need to collect a urine sample: °? 24 hours after you pass the stone. °? 8-12 weeks after passing the kidney stone, and every 6-12 months after that. °· Strain your urine every time you urinate, for as long as directed. Use the strainer that your health care provider recommends. °· Do not throw out   the kidney stone after passing it. Keep the stone so it can be tested by your health care provider. Testing the makeup of your kidney stone may help prevent you from getting kidney stones in the future. °· Take over-the-counter and prescription medicines only as told by your health care provider. °· Keep all follow-up visits as told by your health care provider. This is important. You may need follow-up  X-rays or ultrasounds to make sure that your stone has passed. °How is this prevented? °To prevent another kidney stone: °· Drink enough fluid to keep your urine clear or pale yellow. This is the best way to prevent kidney stones. °· Eat a healthy diet and follow recommendations from your health care provider about foods to avoid. You may be instructed to eat a low-protein diet. Recommendations vary depending on the type of kidney stone that you have. °· Maintain a healthy weight. ° °Contact a health care provider if: °· You have pain that gets worse or does not get better with medicine. °Get help right away if: °· You have a fever or chills. °· You develop severe pain. °· You develop new abdominal pain. °· You faint. °· You are unable to urinate. °This information is not intended to replace advice given to you by your health care provider. Make sure you discuss any questions you have with your health care provider. °Document Released: 09/10/2005 Document Revised: 03/30/2016 Document Reviewed: 02/24/2016 °Elsevier Interactive Patient Education © 2018 [employer] ° ° °Lithotripsy, Care After °This sheet gives you information about how to care for yourself after your procedure. Your health care provider may also give you more specific instructions. If you have problems or questions, contact your health care provider. °What can I expect after the procedure? °After the procedure, it is common to have: °· Some blood in your urine. This should only last for a few days. °· Soreness in your back, sides, or upper abdomen for a few days. °· Blotches or bruises on your back where the pressure wave entered the skin. °· Pain, discomfort, or nausea when pieces (fragments) of the kidney stone move through the tube that carries urine from the kidney to the bladder (ureter). Stone fragments may pass soon after the procedure, but they may continue to pass for up to 4-8 weeks. °? If you have severe pain or nausea, contact your  health care provider. This may be caused by a large stone that was not broken up, and this may mean that you need more treatment. °· Some pain or discomfort during urination. °· Some pain or discomfort in the lower abdomen or (in men) at the base of the penis. ° °Follow these instructions at home: °Medicines °· Take over-the-counter and prescription medicines only as told by your health care provider. °· If you were prescribed an antibiotic medicine, take it as told by your health care provider. Do not stop taking the antibiotic even if you start to feel better. °· Do not drive for 24 hours if you were given a medicine to help you relax (sedative). °· Do not drive or use heavy machinery while taking prescription pain medicine. °Eating and drinking °· Drink enough water and fluids to keep your urine clear or pale yellow. This helps any remaining pieces of the stone to pass. It can also help prevent new stones from forming. °· Eat plenty of fresh fruits and vegetables. °· Follow instructions from your health care provider about eating and drinking restrictions. You may be instructed: °?   To reduce how much salt (sodium) you eat or drink. Check ingredients and nutrition facts on packaged foods and beverages. °? To reduce how much meat you eat. °· Eat the recommended amount of calcium for your age and gender. Ask your health care provider how much calcium you should have. °General instructions °· Get plenty of rest. °· Most people can resume normal activities 1-2 days after the procedure. Ask your health care provider what activities are safe for you. °· If directed, strain all urine through the strainer that was provided by your health care provider. °? Keep all fragments for your health care provider to see. Any stones that are found may be sent to a medical lab for examination. The stone may be as small as a grain of salt. °· Keep all follow-up visits as told by your health care provider. This is important. °Contact a  health care provider if: °· You have pain that is severe or does not get better with medicine. °· You have nausea that is severe or does not go away. °· You have blood in your urine longer than your health care provider told you to expect. °· You have more blood in your urine. °· You have pain during urination that does not go away. °· You urinate more frequently than usual and this does not go away. °· You develop a rash or any other possible signs of an allergic reaction. °Get help right away if: °· You have severe pain in your back, sides, or upper abdomen. °· You have severe pain while urinating. °· Your urine is very dark red. °· You have blood in your stool (feces). °· You cannot pass any urine at all. °· You feel a strong urge to urinate after emptying your bladder. °· You have a fever or chills. °· You develop shortness of breath, difficulty breathing, or chest pain. °· You have severe nausea that leads to persistent vomiting. °· You faint. °Summary °· After this procedure, it is common to have some pain, discomfort, or nausea when pieces (fragments) of the kidney stone move through the tube that carries urine from the kidney to the bladder (ureter). If this pain or nausea is severe, however, you should contact your health care provider. °· Most people can resume normal activities 1-2 days after the procedure. Ask your health care provider what activities are safe for you. °· Drink enough water and fluids to keep your urine clear or pale yellow. This helps any remaining pieces of the stone to pass, and it can help prevent new stones from forming. °· If directed, strain your urine and keep all fragments for your health care provider to see. Fragments or stones may be as small as a grain of salt. °· Get help right away if you have severe pain in your back, sides, or upper abdomen or have severe pain while urinating. °This information is not intended to replace advice given to you by your health care provider.  Make sure you discuss any questions you have with your health care provider. °Document Released: 09/30/2007 Document Revised: 08/01/2016 Document Reviewed: 08/01/2016 °Elsevier Interactive Patient Education © 2018 [employer] ° °

## 2018-04-24 DIAGNOSIS — N200 Calculus of kidney: Secondary | ICD-10-CM | POA: Diagnosis not present

## 2018-04-24 DIAGNOSIS — R102 Pelvic and perineal pain: Secondary | ICD-10-CM | POA: Diagnosis not present

## 2018-04-24 DIAGNOSIS — N432 Other hydrocele: Secondary | ICD-10-CM | POA: Diagnosis not present

## 2018-06-16 DIAGNOSIS — E781 Pure hyperglyceridemia: Secondary | ICD-10-CM | POA: Diagnosis not present

## 2018-06-16 DIAGNOSIS — R739 Hyperglycemia, unspecified: Secondary | ICD-10-CM | POA: Diagnosis not present

## 2018-06-23 DIAGNOSIS — I10 Essential (primary) hypertension: Secondary | ICD-10-CM | POA: Diagnosis not present

## 2018-06-23 DIAGNOSIS — E781 Pure hyperglyceridemia: Secondary | ICD-10-CM | POA: Diagnosis not present

## 2018-06-23 DIAGNOSIS — E119 Type 2 diabetes mellitus without complications: Secondary | ICD-10-CM | POA: Diagnosis not present

## 2018-07-09 IMAGING — DX DG KNEE COMPLETE 4+V*R*
5 series · 5 of 5 positions shown · non-contrast
Comparison: None.

CLINICAL DATA: Right leg pain x1 week

EXAM:
RIGHT KNEE - COMPLETE 4+ VIEW

[knee ap]
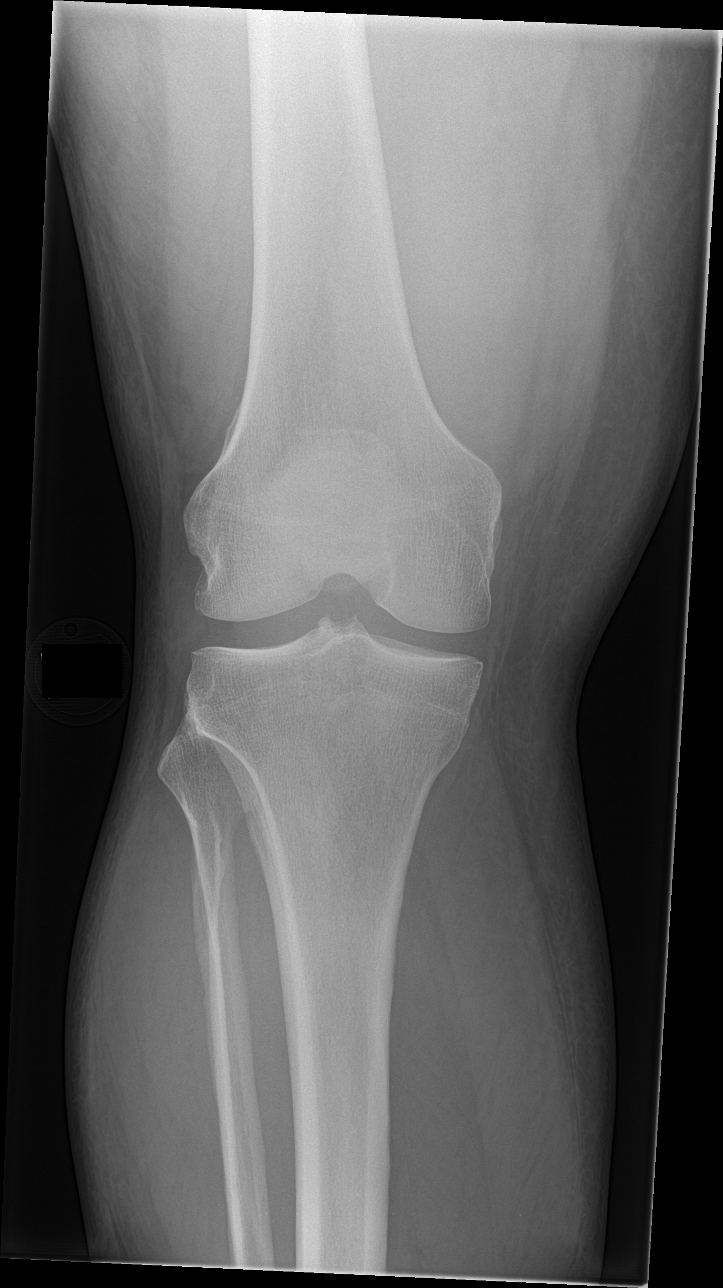

[knee tunnel]
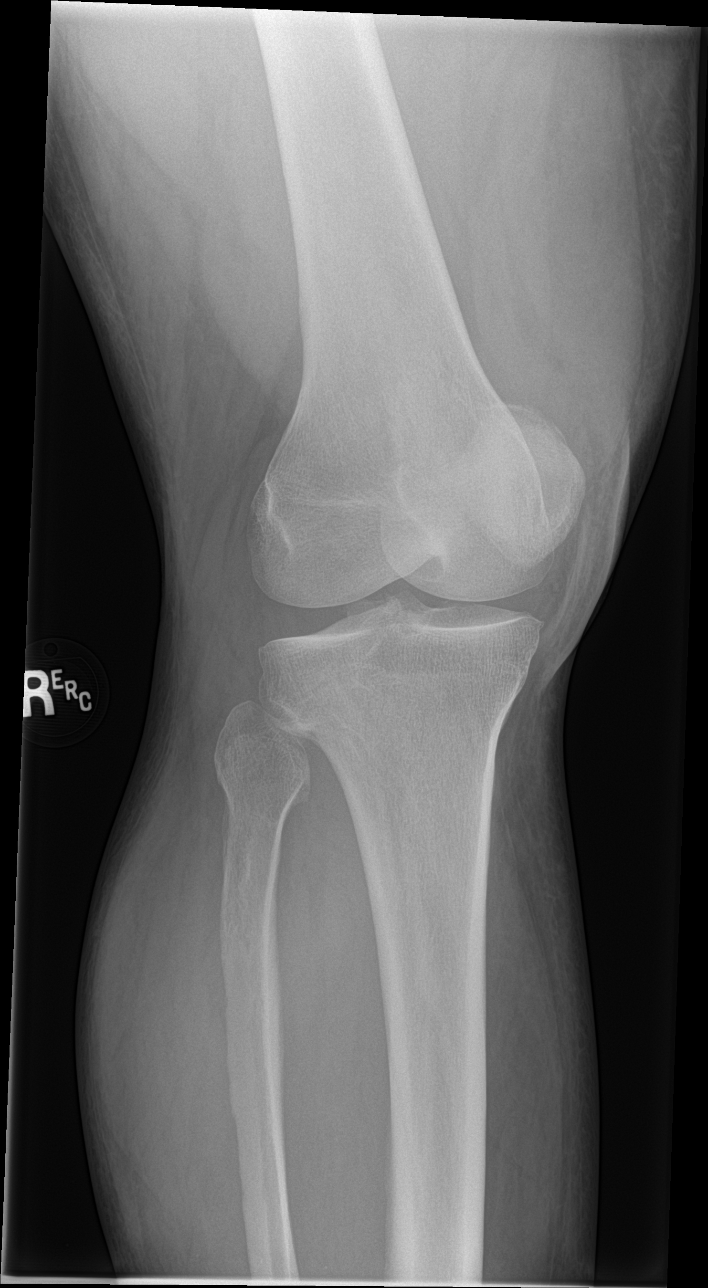

[knee lat]
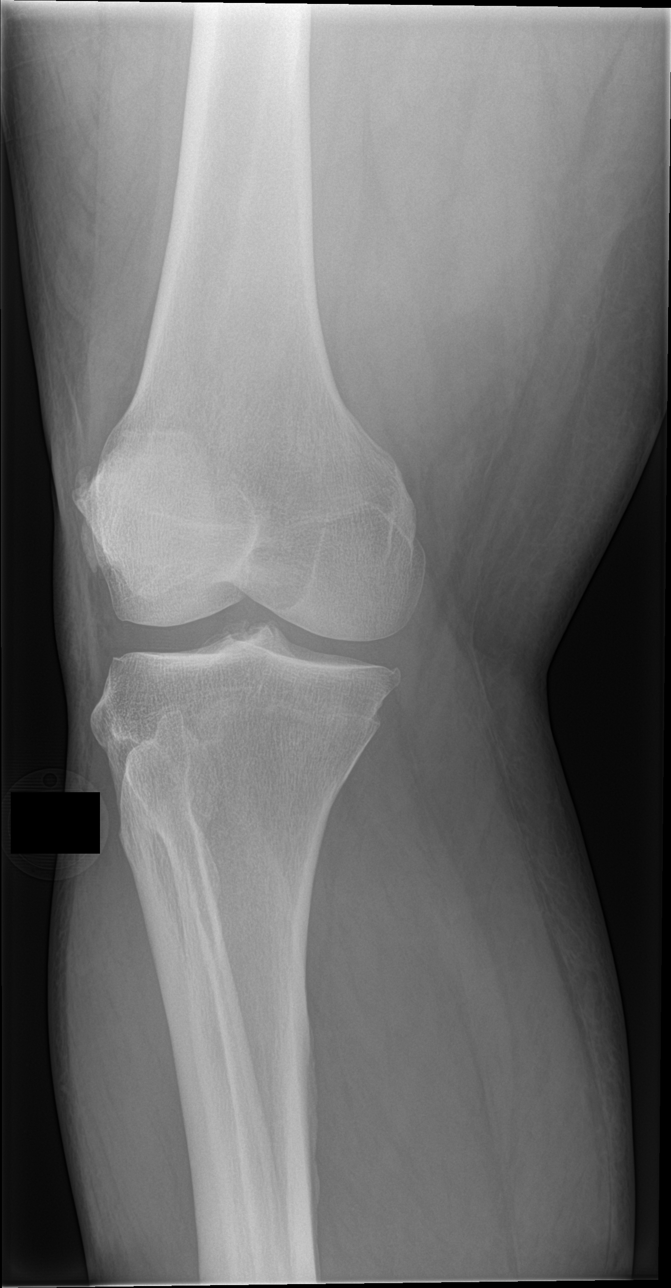

[patella skyline]
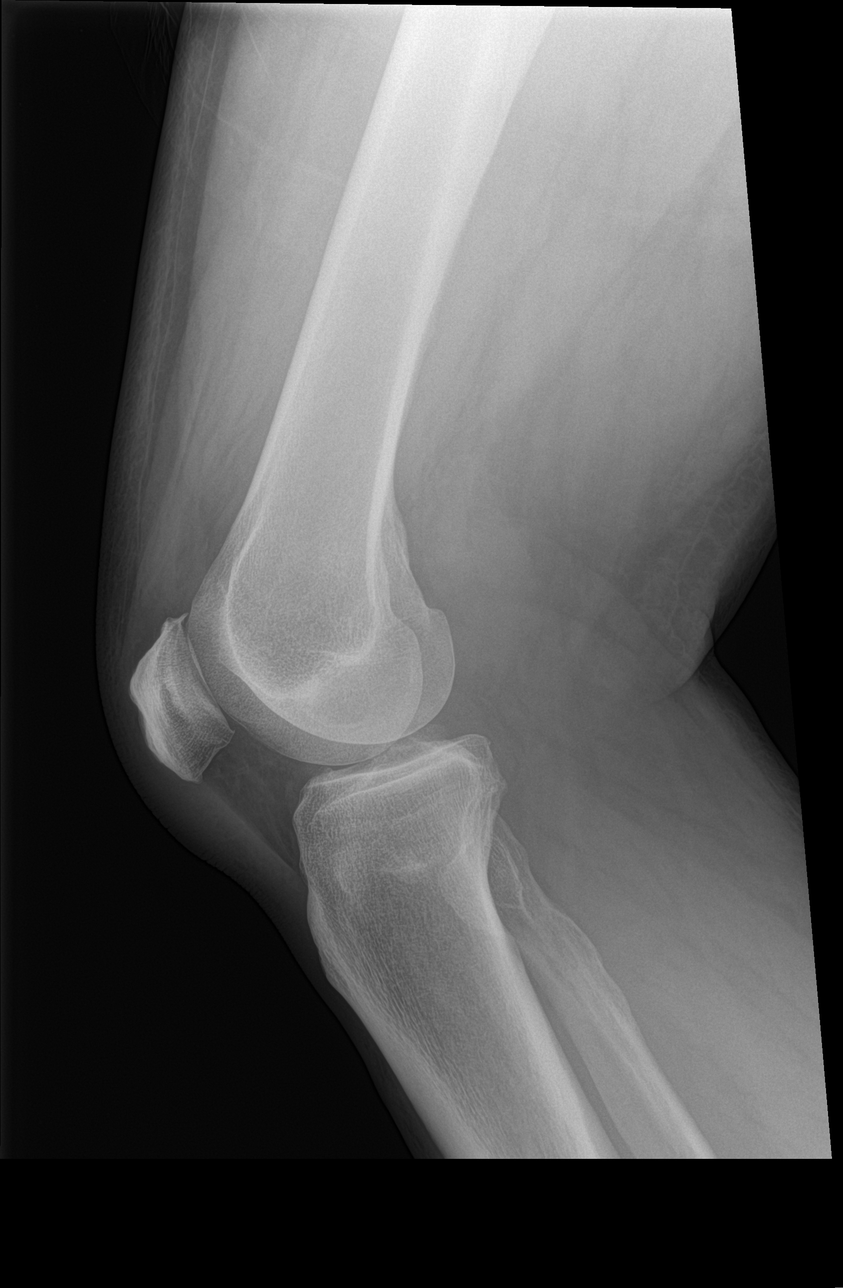

[knee obl]
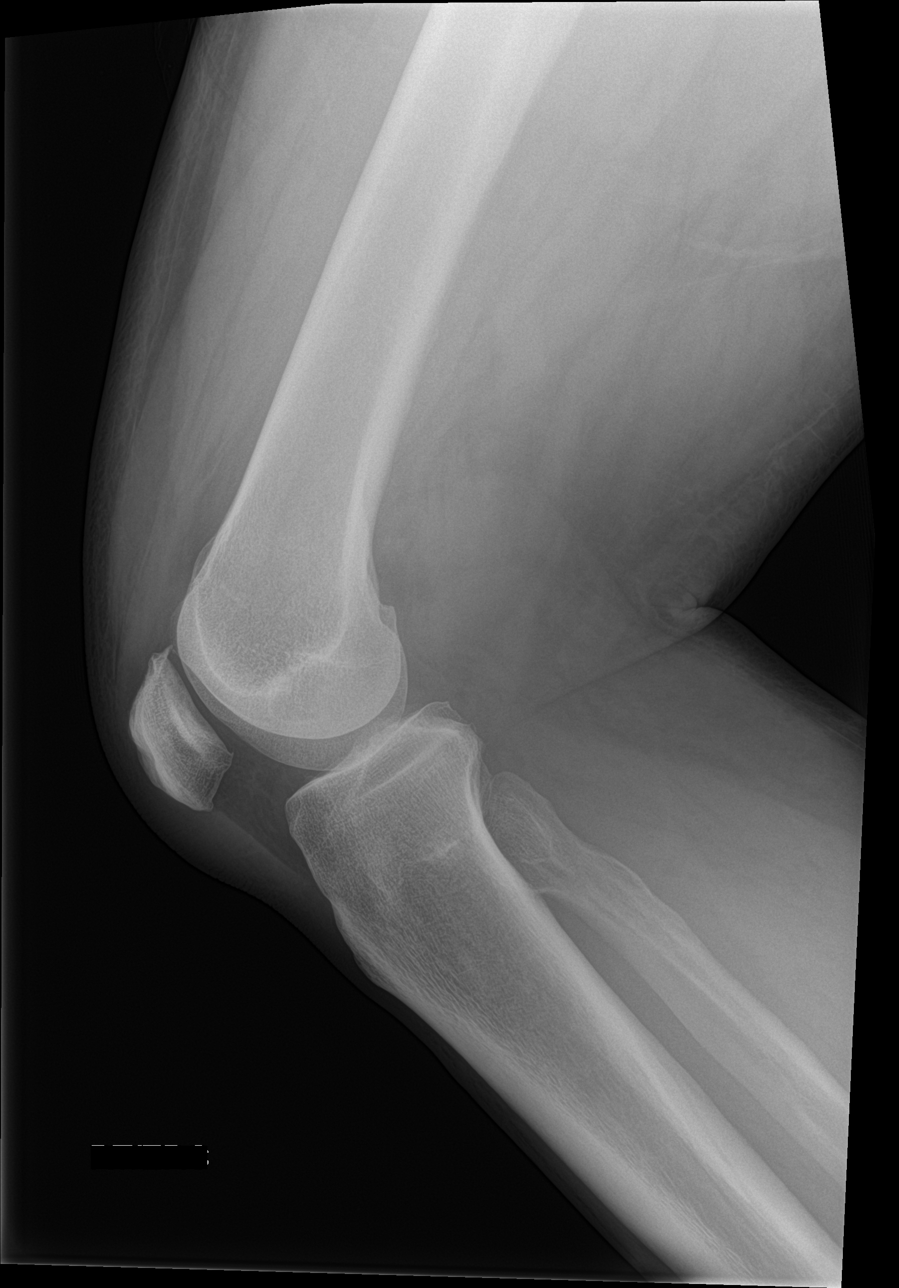

[5 of 5 positions shown; findings below may reference images not displayed]

FINDINGS: No evidence of acute fracture, dislocation, or joint effusion. There
appears be old fracture deformity of the proximal fibula. No
evidence of arthropathy or other focal bone abnormality. Soft
tissues are unremarkable.
IMPRESSION: No acute osseous abnormality. Suggestion old posttraumatic deformity
of the proximal fibula.

## 2018-12-05 DIAGNOSIS — H169 Unspecified keratitis: Secondary | ICD-10-CM | POA: Diagnosis not present

## 2019-02-02 DIAGNOSIS — E119 Type 2 diabetes mellitus without complications: Secondary | ICD-10-CM | POA: Diagnosis not present

## 2019-02-02 DIAGNOSIS — E781 Pure hyperglyceridemia: Secondary | ICD-10-CM | POA: Diagnosis not present

## 2019-02-03 DIAGNOSIS — M79671 Pain in right foot: Secondary | ICD-10-CM | POA: Diagnosis not present

## 2019-02-03 DIAGNOSIS — M7661 Achilles tendinitis, right leg: Secondary | ICD-10-CM | POA: Diagnosis not present

## 2019-02-11 DIAGNOSIS — E119 Type 2 diabetes mellitus without complications: Secondary | ICD-10-CM | POA: Diagnosis not present

## 2019-02-11 DIAGNOSIS — E782 Mixed hyperlipidemia: Secondary | ICD-10-CM | POA: Diagnosis not present

## 2019-02-11 DIAGNOSIS — I10 Essential (primary) hypertension: Secondary | ICD-10-CM | POA: Diagnosis not present

## 2022-04-23 DIAGNOSIS — B078 Other viral warts: Secondary | ICD-10-CM | POA: Diagnosis not present

## 2022-04-23 DIAGNOSIS — Z872 Personal history of diseases of the skin and subcutaneous tissue: Secondary | ICD-10-CM | POA: Diagnosis not present

## 2022-04-23 DIAGNOSIS — L570 Actinic keratosis: Secondary | ICD-10-CM | POA: Diagnosis not present

## 2022-04-23 DIAGNOSIS — L578 Other skin changes due to chronic exposure to nonionizing radiation: Secondary | ICD-10-CM | POA: Diagnosis not present

## 2022-06-08 DIAGNOSIS — Z89512 Acquired absence of left leg below knee: Secondary | ICD-10-CM | POA: Diagnosis not present

## 2022-06-08 DIAGNOSIS — E669 Obesity, unspecified: Secondary | ICD-10-CM | POA: Diagnosis not present

## 2023-04-24 DIAGNOSIS — L570 Actinic keratosis: Secondary | ICD-10-CM | POA: Diagnosis not present

## 2023-04-24 DIAGNOSIS — Z872 Personal history of diseases of the skin and subcutaneous tissue: Secondary | ICD-10-CM | POA: Diagnosis not present

## 2023-04-24 DIAGNOSIS — L578 Other skin changes due to chronic exposure to nonionizing radiation: Secondary | ICD-10-CM | POA: Diagnosis not present

## 2024-05-19 DIAGNOSIS — H25813 Combined forms of age-related cataract, bilateral: Secondary | ICD-10-CM | POA: Diagnosis not present

## 2024-08-25 DIAGNOSIS — Z1331 Encounter for screening for depression: Secondary | ICD-10-CM | POA: Diagnosis not present

## 2024-08-25 DIAGNOSIS — E119 Type 2 diabetes mellitus without complications: Secondary | ICD-10-CM | POA: Diagnosis not present

## 2024-08-25 DIAGNOSIS — E781 Pure hyperglyceridemia: Secondary | ICD-10-CM | POA: Diagnosis not present

## 2024-08-25 DIAGNOSIS — Z89512 Acquired absence of left leg below knee: Secondary | ICD-10-CM | POA: Diagnosis not present

## 2024-08-25 DIAGNOSIS — Z0000 Encounter for general adult medical examination without abnormal findings: Secondary | ICD-10-CM | POA: Diagnosis not present

## 2024-08-25 DIAGNOSIS — Z125 Encounter for screening for malignant neoplasm of prostate: Secondary | ICD-10-CM | POA: Diagnosis not present

## 2024-08-25 DIAGNOSIS — Z1211 Encounter for screening for malignant neoplasm of colon: Secondary | ICD-10-CM | POA: Diagnosis not present

## 2024-08-25 DIAGNOSIS — I10 Essential (primary) hypertension: Secondary | ICD-10-CM | POA: Diagnosis not present

## 2024-11-02 ENCOUNTER — Ambulatory Visit: Admission: RE | Admit: 2024-11-02 | Admitting: Gastroenterology

## 2024-11-02 ENCOUNTER — Encounter: Admission: RE | Payer: Self-pay | Source: Home / Self Care
